# Patient Record
Sex: Male | Born: 1935
Health system: Southern US, Community
[De-identification: ages and names within clinical notes are randomized; demographics above are authoritative.]

## PROBLEM LIST (undated history)

## (undated) DIAGNOSIS — F329 Major depressive disorder, single episode, unspecified: Secondary | ICD-10-CM

## (undated) DIAGNOSIS — F32A Depression, unspecified: Secondary | ICD-10-CM

## (undated) HISTORY — DX: Major depressive disorder, single episode, unspecified: F32.9

## (undated) HISTORY — DX: Depression, unspecified: F32.A

---

## 2018-10-06 ENCOUNTER — Ambulatory Visit (HOSPITAL_BASED_OUTPATIENT_CLINIC_OR_DEPARTMENT_OTHER)
Admission: RE | Admit: 2018-10-06 | Discharge: 2018-10-06 | Disposition: A | Discharge status: Home / Self Care | Payer: Medicare HMO | Source: Ambulatory Visit | Attending: Family Medicine | Admitting: Family Medicine

## 2018-10-06 ENCOUNTER — Encounter: Payer: Self-pay | Admitting: Family Medicine

## 2018-10-06 ENCOUNTER — Ambulatory Visit (INDEPENDENT_AMBULATORY_CARE_PROVIDER_SITE_OTHER): Payer: Medicare HMO | Admitting: Family Medicine

## 2018-10-06 VITALS — BP 128/72 | HR 133 | Temp 98.0°F | Ht 67.5 in | Wt 254.2 lb

## 2018-10-06 DIAGNOSIS — M25561 Pain in right knee: Secondary | ICD-10-CM | POA: Insufficient documentation

## 2018-10-06 DIAGNOSIS — R1031 Right lower quadrant pain: Secondary | ICD-10-CM | POA: Insufficient documentation

## 2018-10-06 DIAGNOSIS — G8929 Other chronic pain: Secondary | ICD-10-CM | POA: Insufficient documentation

## 2018-10-06 DIAGNOSIS — F325 Major depressive disorder, single episode, in full remission: Secondary | ICD-10-CM | POA: Diagnosis not present

## 2018-10-06 DIAGNOSIS — K219 Gastro-esophageal reflux disease without esophagitis: Secondary | ICD-10-CM

## 2018-10-06 DIAGNOSIS — M7521 Bicipital tendinitis, right shoulder: Secondary | ICD-10-CM | POA: Insufficient documentation

## 2018-10-06 MED ORDER — OMEPRAZOLE 20 MG PO CPDR: 20.0000 mg | DELAYED_RELEASE_CAPSULE | Freq: Every day | ORAL | 2 refills | Status: DC

## 2018-10-06 NOTE — Progress Notes (Signed)
Chief Complaint  Patient presents with  . New Patient (Initial Visit)       New Patient Visit SUBJECTIVE: HPI: Mike Francis is an 85 y.o.male who is being seen for establishing care.  The patient was previously seen at an office in [place].  Hx of R sided pain that got worse recently. From neck down to knee. No inj or change in activity. No imaging or physical therapy done in the past.  This is gotten worse over the past 6 months.  Pain is on the right side of his neck radiating down the inside of his upper arm.  He also has low back pain rating into his glutes and groin.  He has knee pain as well.  He is worried he may have arthritis.  Patient has a history of reflux that is controlled with omeprazole.  He takes 1 tab per day.  He has a history of depression that he views is situational.  That situation is now resolved, where he does not have any family, and he is interested in coming off of the medication.  He is tolerating well.  No Known Allergies  Past Medical History:  Diagnosis Date  . Depression    History reviewed. No pertinent surgical history. Family History  Problem Relation Age of Onset  . Arthritis Mother   . Diabetes Mother    No Known Allergies  Current Outpatient Medications:  .  omeprazole (PRILOSEC) 20 MG capsule, Take 1 capsule (20 mg total) by mouth daily., Disp: 90 capsule, Rfl: 2 .  sertraline (ZOLOFT) 100 MG tablet, Take 0.5 tablets (50 mg total) by mouth daily., Disp: , Rfl:   ROS Psych: Denies depression  MSK: +knee pain   OBJECTIVE: BP 128/72 (BP Location: Left Arm, Patient Position: Sitting, Cuff Size: Normal)   Pulse (!) 133   Temp 98 F (36.7 C) (Oral)   Ht 5' 7.5" (1.715 m)   Wt 254 lb 4 oz (115.3 kg)   SpO2 97%   BMI 39.23 kg/m   Constitutional: -  VS reviewed -  Well developed, well nourished, appears stated age -  No apparent distress  Psychiatric: -  Oriented to person, place, and time -  Memory intact -  Affect and mood  normal -  Fluent conversation, good eye contact -  Judgment and insight age appropriate  Eye: -  Conjunctivae clear, no discharge -  Pupils symmetric, round, reactive to light  ENMT: -  MMM    Pharynx moist, no exudate, no erythema  Neck: -  No gross swelling, no palpable masses -  Thyroid midline, not enlarged, mobile, no palpable masses  Cardiovascular: -  RRR -  No LE edema  Respiratory: -  Normal respiratory effort, no accessory muscle use, no retraction -  Breath sounds equal, no wheezes, no ronchi, no crackles  Musculoskeletal: -  No clubbing, no cyanosis -  R shoulder: +TTP over coracoid, + speeds, empty can; unable to relax for Hawkins, negative Neer's and crossover -Poor range of motion of hamstrings bilaterally   R knee: No effusion, no joint line tenderness, patella is unremarkable, negative posterior/anterior drawer, varus/valgus stress, McMurray's -Right hip: +Stinchfield, neg log roll  Skin: -  No significant lesion on inspection -  Warm and dry to palpation   ASSESSMENT/PLAN: Depression, major, single episode, complete remission (HCC) - Plan: sertraline (ZOLOFT) 100 MG tablet  Biceps tendinitis of right upper extremity  Chronic pain of right knee - Plan: DG Knee Complete 4 Views  Right  Right groin pain - Plan: XR HIP UNILAT W OR W/O PELVIS 2-3 VIEWS RIGHT  Gastroesophageal reflux disease, esophagitis presence not specified - Plan: omeprazole (PRILOSEC) 20 MG capsule  Patient has records being sent here. We will decrease Zoloft from 100 mg daily to 50 mg daily. Stretches and exercises provided for biceps tendinitis. Check x-ray of knee and hip. Continue omeprazole. Patient should return 4 weeks to recheck extremities and see how he is doing on Zoloft.  If he is still doing well, we will take him off of it. The patient voiced understanding and agreement to the plan.   Jilda Roche [place], DO 10/06/18  12:15 PM

## 2018-10-06 NOTE — Patient Instructions (Signed)
Heat (pad or rice pillow in microwave) over affected area, 10-15 minutes twice daily.   OK to take Tylenol 1000 mg (2 extra strength tabs) or 975 mg (3 regular strength tabs) every 6 hours as needed.  We will be in contact regarding your X-rays.    Biceps Tendon Disruption (Proximal) Rehab Do exercises exactly as told by your health care provider and adjust them as directed. It is normal to feel mild stretching, pulling, tightness, or discomfort as you do these exercises, but you should stop right away if you feel sudden pain or your pain gets worse. Stretching and range of motion exercises These exercises warm up your muscles and joints and improve the movement and flexibility of your arm and shoulder. These exercises also help to relieve pain and stiffness. Exercise A: Shoulder flexion, standing   1. Stand facing a wall. Put your left / right hand on the wall. 2. Slide your left / right hand up the wall. Stop when you feel a stretch in your shoulder, or when you reach the angle recommended by your health care provider.  Use your other hand to help raise your arm, if needed.  As your hand gets higher, you may need to step closer to the wall.  Avoid shrugging your shoulder while you raise your arm. To do this, keep your shoulder blade tucked down toward your spine. 3. Hold for 30 seconds. 4. Slowly return to the starting position. Use your other arm to help, if needed. Repeat 2 times. Complete this exercise 3 times per week. Exercise B: Pendulum   1. Stand near a wall or a surface that you can hold onto for balance. 2. Bend at the waist and let your left / right arm hang straight down. Use your other arm to support you. 3. Relax your arm and shoulder muscles, and move your hips and your trunk so your left / right arm swings freely. Your arm should swing because of the motion of your body, not because you are using your arm or shoulder muscles. 4. Keep moving so your arm swings in the  following directions, as told by your health care provider:  Side to side.  Forward and backward.  In clockwise and counterclockwise circles. 5. Slowly return to the starting position. Repeat 2 times. Complete this exercise 3 times per week.  Strengthening exercises These exercises build strength and endurance in your arm and shoulder. Endurance is the ability to use your muscles for a long time, even after your muscles get tired. Exercise C: Elbow flexion, neutral  1. Sit on a stable chair without armrests, or stand. 2. Hold a 3-5 lb weight in your left / right hand, or hold an exercise band with both hands. Your palms should face each other at the starting position. 3. Bend your left / right elbow and move your hand up toward your shoulder.  Lead with your thumb, and keep your palm facing the same direction.  Keep your other arm straight down, in the starting position. 4. Slowly return to the starting position. Repeat 2-3 times. Complete this exercise 3 times per week. Exercise D: Forearm supination   1. Sit with your left / right forearm on a table. Your elbow should be below shoulder height. Rest your hand over the edge of the table so your palm faces down. 2. If directed, hold a hammer with your left / right hand. 3. Without moving your elbow, slowly rotate your hand so your palm faces up  toward the ceiling.  If you are holding a hammer, begin by holding the hammer near the head. When this exercise gets easier for you, hold the hammer farther down the handle. 4. Hold for 3 seconds. 5. Slowly return to the starting position. Repeat 2 times. Complete this exercise 3 times per week. Exercise E: Scapular retraction   1. Sit in a stable chair without armrests, or stand. 2. Secure an exercise band to a stable object in front of you so the band is at shoulder height. 3. Hold one end of the exercise band in each hand. 4. Squeeze your shoulder blades together and move your elbows  slightly behind you. Do not shrug your shoulders. 5. Hold for 3 seconds. 6. Slowly return to the starting position. Repeat 2 times. Complete this exercise 3 times per week. Exercise F: Scapular protraction, supine   1. Lie on your back on a firm surface. Hold a 3-5 lb weight in your left / right hand. 2. Raise your left / right arm straight into the air so your hand is directly above your shoulder joint. 3. Push the weight into the air so your shoulder lifts off of the surface that you are lying on. Do not move your head, neck, or back. 4. Hold for 3 seconds. 5. Slowly return to the starting position. Let your muscles relax completely before you repeat this exercise. Repeat 2 times. Complete this exercise 3 times per week. This information is not intended to replace advice given to you by your health care provider. Make sure you discuss any questions you have with your health care provider. Document Released: 08/20/2005 Document Revised: 04/26/2016 Document Reviewed: 07/29/2015 Elsevier Interactive Patient Education  2017 [employer].

## 2018-10-08 ENCOUNTER — Other Ambulatory Visit: Payer: Self-pay | Admitting: Family Medicine

## 2018-10-08 DIAGNOSIS — M25551 Pain in right hip: Secondary | ICD-10-CM

## 2018-10-08 DIAGNOSIS — M549 Dorsalgia, unspecified: Secondary | ICD-10-CM

## 2018-10-08 DIAGNOSIS — G8929 Other chronic pain: Secondary | ICD-10-CM

## 2018-10-15 ENCOUNTER — Telehealth: Payer: Self-pay | Admitting: *Deleted

## 2018-10-15 ENCOUNTER — Telehealth: Payer: Self-pay | Admitting: Family Medicine

## 2018-10-15 NOTE — Telephone Encounter (Signed)
Copied from CRM [id number]. Topic: Quick Communication - Home Health Verbal Orders >> Oct 14, 2018 12:51 PM Lynne Logan D wrote: Caller/Agency: [facility] Number: [phone] Requesting OT/PT/Skilled Nursing/Social Work: PT Frequency: 1 week 5   Called [facility] no answer//no voice mail to leave a message

## 2018-10-15 NOTE — Telephone Encounter (Signed)
Called no answer no voicemail

## 2018-10-15 NOTE — Telephone Encounter (Signed)
[facility] called back and gave verbal ok PER PCP for PT>

## 2018-10-15 NOTE — Telephone Encounter (Signed)
Received Physician Orders from [facility]; forwarded to provider/SLS 02/12  

## 2018-10-15 NOTE — Telephone Encounter (Signed)
Joe from [facility] is calling to state he is needing verbal orders to be completed to start PT  For the patient. He would like a call back at [phone]

## 2018-10-22 ENCOUNTER — Telehealth: Payer: Self-pay | Admitting: *Deleted

## 2018-10-22 NOTE — Telephone Encounter (Signed)
Received Home Health Certification and Plan of Care; forwarded to provider/SLS 02/19 

## 2018-11-07 ENCOUNTER — Telehealth: Payer: Self-pay | Admitting: *Deleted

## 2018-11-07 NOTE — Telephone Encounter (Signed)
Received Episode Detail Report from [facility]; forwarded to provider/SLS 03/06

## 2019-02-04 ENCOUNTER — Encounter: Payer: Self-pay | Admitting: Family Medicine

## 2019-02-04 ENCOUNTER — Other Ambulatory Visit: Payer: Self-pay

## 2019-02-04 ENCOUNTER — Ambulatory Visit (INDEPENDENT_AMBULATORY_CARE_PROVIDER_SITE_OTHER): Payer: Medicare HMO | Admitting: Family Medicine

## 2019-02-04 ENCOUNTER — Ambulatory Visit (HOSPITAL_BASED_OUTPATIENT_CLINIC_OR_DEPARTMENT_OTHER)
Admission: RE | Admit: 2019-02-04 | Discharge: 2019-02-04 | Disposition: A | Discharge status: Home / Self Care | Payer: Medicare HMO | Source: Ambulatory Visit | Attending: Family Medicine | Admitting: Family Medicine

## 2019-02-04 VITALS — BP 144/80 | HR 86 | Temp 99.0°F | Ht 67.0 in | Wt 262.2 lb

## 2019-02-04 DIAGNOSIS — F325 Major depressive disorder, single episode, in full remission: Secondary | ICD-10-CM | POA: Diagnosis not present

## 2019-02-04 DIAGNOSIS — M542 Cervicalgia: Secondary | ICD-10-CM

## 2019-02-04 DIAGNOSIS — G8929 Other chronic pain: Secondary | ICD-10-CM | POA: Insufficient documentation

## 2019-02-04 NOTE — Progress Notes (Signed)
Chief Complaint  Patient presents with  . Follow-up    Subjective: Patient is a 85 y.o. male here for f/u.  Pt had been decreased on Zoloft from 100 mg/d to 50 mg/d. Doing well. Situational issues have been eliminated. Not following with a counselor or psychologist. Interested in coming off altogether.  Continued having R sided neck pain. PT was not helpful. Worse in the AM. He is convinced that is it not muscular. Has never had imaging. No weakness in UE's. No recent worsening.  ROS: MSK: +Neck pain Psych: As noted in HPI  Past Medical History:  Diagnosis Date  . Depression     Objective: BP (!) 144/80 (BP Location: Left Arm, Patient Position: Sitting, Cuff Size: Normal)   Pulse 86   Temp 99 F (37.2 C) (Oral)   Ht 5\' 7"  (1.702 m)   Wt 262 lb 4 oz (119 kg)   SpO2 95%   BMI 41.07 kg/m  General: Awake, appears stated age Neuro: Neg Spurling's b/l, DTR's equal and symmetric in UE's b/l, no clonus; ambulates with aid of walker Heart: RRR, no bruits Lungs: CTAB, no rales, wheezes or rhonchi. No accessory muscle use Psych: Age appropriate judgment and insight, normal affect and mood  Assessment and Plan: Chronic neck pain - Plan: DG Cervical Spine Complete  Depression, major, single episode, complete remission (HCC)  1- Ck X-ray. Will refer to spine specialist if unremarkable. Cont Tylenol, heat, ice. 2- OK to come off of Zoloft. Sounds like it was quite situational.  Re-sign records release form.  F/u in 3 mo for CPE. The patient voiced understanding and agreement to the plan.  Jilda Roche [place], DO 02/04/19  3:24 PM

## 2019-02-04 NOTE — Patient Instructions (Addendum)
OK to stop the Zoloft.   We will be in contact regarding your X-ray. We may set you up with a spine specialist.   Heat (pad or rice pillow in microwave) over affected area, 10-15 minutes twice daily.   OK to take Tylenol 1000 mg (2 extra strength tabs) or 975 mg (3 regular strength tabs) every 6 hours as needed.  Let us know if you need anything.  EXERCISES RANGE OF MOTION (ROM) AND STRETCHING EXERCISES  These exercises may help you when beginning to rehabilitate your issue. In order to successfully resolve your symptoms, you must improve your posture. These exercises are designed to help reduce the forward-head and rounded-shoulder posture which contributes to this condition. Your symptoms may resolve with or without further involvement from your physician, physical therapist or athletic trainer. While completing these exercises, remember:   Restoring tissue flexibility helps normal motion to return to the joints. This allows healthier, less painful movement and activity.  An effective stretch should be held for at least 20 seconds, although you may need to begin with shorter hold times for comfort.  A stretch should never be painful. You should only feel a gentle lengthening or release in the stretched tissue.  Do not do any stretch or exercise that you cannot tolerate.  STRETCH- Axial Extensors  Lie on your back on the floor. You may bend your knees for comfort. Place a rolled-up hand towel or dish towel, about 2 inches in diameter, under the part of your head that makes contact with the floor.  Gently tuck your chin, as if trying to make a "double chin," until you feel a gentle stretch at the base of your head.  Hold 15-20 seconds. Repeat 2-3 times. Complete this exercise 1 time per day.   STRETCH - Axial Extension   Stand or sit on a firm surface. Assume a good posture: chest up, shoulders drawn back, abdominal muscles slightly tense, knees unlocked (if standing) and feet hip  width apart.  Slowly retract your chin so your head slides back and your chin slightly lowers. Continue to look straight ahead.  You should feel a gentle stretch in the back of your head. Be certain not to feel an aggressive stretch since this can cause headaches later.  Hold for 15-20 seconds. Repeat 2-3 times. Complete this exercise 1 time per day.  STRETCH - Cervical Side Bend   Stand or sit on a firm surface. Assume a good posture: chest up, shoulders drawn back, abdominal muscles slightly tense, knees unlocked (if standing) and feet hip width apart.  Without letting your nose or shoulders move, slowly tip your right / left ear to your shoulder until your feel a gentle stretch in the muscles on the opposite side of your neck.  Hold 15-20 seconds. Repeat 2-3 times. Complete this exercise 1-2 times per day.  STRETCH - Cervical Rotators   Stand or sit on a firm surface. Assume a good posture: chest up, shoulders drawn back, abdominal muscles slightly tense, knees unlocked (if standing) and feet hip width apart.  Keeping your eyes level with the ground, slowly turn your head until you feel a gentle stretch along the back and opposite side of your neck.  Hold 15-20 seconds. Repeat 2-3 times. Complete this exercise 1-2 times per day.  RANGE OF MOTION - Neck Circles   Stand or sit on a firm surface. Assume a good posture: chest up, shoulders drawn back, abdominal muscles slightly tense, knees unlocked (if standing) and  feet hip width apart.  Gently roll your head down and around from the back of one shoulder to the back of the other. The motion should never be forced or painful.  Repeat the motion 10-20 times, or until you feel the neck muscles relax and loosen. Repeat 2-3 times. Complete the exercise 1-2 times per day. STRENGTHENING EXERCISES - Cervical Strain and Sprain These exercises may help you when beginning to rehabilitate your injury. They may resolve your symptoms with or  without further involvement from your physician, physical therapist, or athletic trainer. While completing these exercises, remember:   Muscles can gain both the endurance and the strength needed for everyday activities through controlled exercises.  Complete these exercises as instructed by your physician, physical therapist, or athletic trainer. Progress the resistance and repetitions only as guided.  You may experience muscle soreness or fatigue, but the pain or discomfort you are trying to eliminate should never worsen during these exercises. If this pain does worsen, stop and make certain you are following the directions exactly. If the pain is still present after adjustments, discontinue the exercise until you can discuss the trouble with your clinician.  STRENGTH - Cervical Flexors, Isometric  Face a wall, standing about 6 inches away. Place a small pillow, a ball about 6-8 inches in diameter, or a folded towel between your forehead and the wall.  Slightly tuck your chin and gently push your forehead into the soft object. Push only with mild to moderate intensity, building up tension gradually. Keep your jaw and forehead relaxed.  Hold 10 to 20 seconds. Keep your breathing relaxed.  Release the tension slowly. Relax your neck muscles completely before you start the next repetition. Repeat 2-3 times. Complete this exercise 1 time per day.  STRENGTH- Cervical Lateral Flexors, Isometric   Stand about 6 inches away from a wall. Place a small pillow, a ball about 6-8 inches in diameter, or a folded towel between the side of your head and the wall.  Slightly tuck your chin and gently tilt your head into the soft object. Push only with mild to moderate intensity, building up tension gradually. Keep your jaw and forehead relaxed.  Hold 10 to 20 seconds. Keep your breathing relaxed.  Release the tension slowly. Relax your neck muscles completely before you start the next repetition. Repeat  2-3 times. Complete this exercise 1 time per day.  STRENGTH - Cervical Extensors, Isometric   Stand about 6 inches away from a wall. Place a small pillow, a ball about 6-8 inches in diameter, or a folded towel between the back of your head and the wall.  Slightly tuck your chin and gently tilt your head back into the soft object. Push only with mild to moderate intensity, building up tension gradually. Keep your jaw and forehead relaxed.  Hold 10 to 20 seconds. Keep your breathing relaxed.  Release the tension slowly. Relax your neck muscles completely before you start the next repetition. Repeat 2-3 times. Complete this exercise 1 time per day.  POSTURE AND BODY MECHANICS CONSIDERATIONS Keeping correct posture when sitting, standing or completing your activities will reduce the stress put on different body tissues, allowing injured tissues a chance to heal and limiting painful experiences. The following are general guidelines for improved posture. Your physician or physical therapist will provide you with any instructions specific to your needs. While reading these guidelines, remember:  The exercises prescribed by your provider will help you have the flexibility and strength to maintain correct  postures.  The correct posture provides the optimal environment for your joints to work. All of your joints have less wear and tear when properly supported by a spine with good posture. This means you will experience a healthier, less painful body.  Correct posture must be practiced with all of your activities, especially prolonged sitting and standing. Correct posture is as important when doing repetitive low-stress activities (typing) as it is when doing a single heavy-load activity (lifting).  PROLONGED STANDING WHILE SLIGHTLY LEANING FORWARD When completing a task that requires you to lean forward while standing in one place for a long time, place either foot up on a stationary 2- to 4-inch high  object to help maintain the best posture. When both feet are on the ground, the low back tends to lose its slight inward curve. If this curve flattens (or becomes too large), then the back and your other joints will experience too much stress, fatigue more quickly, and can cause pain.   RESTING POSITIONS Consider which positions are most painful for you when choosing a resting position. If you have pain with flexion-based activities (sitting, bending, stooping, squatting), choose a position that allows you to rest in a less flexed posture. You would want to avoid curling into a fetal position on your side. If your pain worsens with extension-based activities (prolonged standing, working overhead), avoid resting in an extended position such as sleeping on your stomach. Most people will find more comfort when they rest with their spine in a more neutral position, neither too rounded nor too arched. Lying on a non-sagging bed on your side with a pillow between your knees, or on your back with a pillow under your knees will often provide some relief. Keep in mind, being in any one position for a prolonged period of time, no matter how correct your posture, can still lead to stiffness.  WALKING Walk with an upright posture. Your ears, shoulders, and hips should all line up. OFFICE WORK When working at a desk, create an environment that supports good, upright posture. Without extra support, muscles fatigue and lead to excessive strain on joints and other tissues.  CHAIR:  A chair should be able to slide under your desk when your back makes contact with the back of the chair. This allows you to work closely.  The chair's height should allow your eyes to be level with the upper part of your monitor and your hands to be slightly lower than your elbows.  Body position: ? Your feet should make contact with the floor. If this is not possible, use a foot rest. ? Keep your ears over your shoulders. This will  reduce stress on your neck and low back.

## 2019-02-06 ENCOUNTER — Other Ambulatory Visit: Payer: Self-pay | Admitting: Family Medicine

## 2019-02-06 DIAGNOSIS — M5030 Other cervical disc degeneration, unspecified cervical region: Secondary | ICD-10-CM

## 2019-02-06 NOTE — Progress Notes (Signed)
Referral done

## 2019-03-10 ENCOUNTER — Encounter: Payer: Self-pay | Admitting: Physical Medicine & Rehabilitation

## 2019-03-16 ENCOUNTER — Telehealth: Payer: Self-pay | Admitting: *Deleted

## 2019-03-16 ENCOUNTER — Encounter: Payer: Self-pay | Admitting: Physical Medicine & Rehabilitation

## 2019-03-16 ENCOUNTER — Other Ambulatory Visit: Payer: Self-pay

## 2019-03-16 ENCOUNTER — Encounter: Payer: Medicare Other | Attending: Physical Medicine & Rehabilitation | Admitting: Physical Medicine & Rehabilitation

## 2019-03-16 VITALS — BP 112/72 | HR 83 | Temp 97.7°F | Ht 67.0 in | Wt 263.0 lb

## 2019-03-16 DIAGNOSIS — R269 Unspecified abnormalities of gait and mobility: Secondary | ICD-10-CM | POA: Diagnosis not present

## 2019-03-16 DIAGNOSIS — M542 Cervicalgia: Secondary | ICD-10-CM | POA: Diagnosis present

## 2019-03-16 DIAGNOSIS — M791 Myalgia, unspecified site: Secondary | ICD-10-CM | POA: Insufficient documentation

## 2019-03-16 DIAGNOSIS — G8929 Other chronic pain: Secondary | ICD-10-CM | POA: Insufficient documentation

## 2019-03-16 DIAGNOSIS — H9193 Unspecified hearing loss, bilateral: Secondary | ICD-10-CM | POA: Diagnosis present

## 2019-03-16 DIAGNOSIS — G479 Sleep disorder, unspecified: Secondary | ICD-10-CM | POA: Diagnosis not present

## 2019-03-16 MED ORDER — METHOCARBAMOL 500 MG PO TABS: 500.0000 mg | ORAL_TABLET | Freq: Two times a day (BID) | ORAL | 1 refills | Status: AC | PRN

## 2019-03-16 NOTE — Progress Notes (Addendum)
Subjective:    Patient ID: Mike Francis, male    DOB: 07/31/1931, 85 y.o.   MRN: [id number]  HPI  85 year old male with past medical history of depression, HOH, morbid obesity, CAD, Testosterone deficiency, polycythemia, OSA, osteopenia presents with right neck pain. History taken from chart and patient. Patient states he had imaging in [place] that showed ?radiculopathy. He notes neck pain since ~20 years.  Stable.  No alleviating factors, later states activity improves . Aleve helps. Mainly in the morning.  Can't describe.  States radiates from neck to foot on right side with facial symptoms at times.  Intermittent.  No benefit with PT. Denies associated numbness or weakness.  Falls bending over to pick something up, notes balance deficits. Pain does not limit anything.  Relatively poor historian.   Pain Inventory Average Pain 3 Pain Right Now 3 My pain is n/a  In the last 24 hours, has pain interfered with the following? General activity 0 Relation with others 0 Enjoyment of life 0 What TIME of day is your pain at its worst? morning,night Sleep (in general) Poor  Pain is worse with: walking Pain improves with: tylenol Relief from Meds: 0  Mobility use a walker do you drive?  yes  Function retired  Neuro/Psych weakness trouble walking depression  Prior Studies n/a  Physicians involved in your care n/a   Family History  Problem Relation Age of Onset  . Arthritis Mother   . Diabetes Mother    Social History   Socioeconomic History  . Marital status: Single    Spouse name: Not on file  . Number of children: Not on file  . Years of education: Not on file  . Highest education level: Not on file  Occupational History  . Not on file  Social Needs  . Financial resource strain: Not on file  . Food insecurity    Worry: Not on file    Inability: Not on file  . Transportation needs    Medical: Not on file    Non-medical: Not on file  Tobacco Use  .  Smoking status: Never Smoker  . Smokeless tobacco: Never Used  Substance and Sexual Activity  . Alcohol use: Never    Frequency: Never  . Drug use: Never  . Sexual activity: Not on file  Lifestyle  . Physical activity    Days per week: Not on file    Minutes per session: Not on file  . Stress: Not on file  Relationships  . Social Musicianconnections    Talks on phone: Not on file    Gets together: Not on file    Attends religious service: Not on file    Active member of club or organization: Not on file    Attends meetings of clubs or organizations: Not on file    Relationship status: Not on file  Other Topics Concern  . Not on file  Social History Narrative  . Not on file   No past surgical history on file. Past Medical History:  Diagnosis Date  . Depression    BP 112/72   Pulse 83   Temp 97.7 F (36.5 C)   Ht 5\' 7"  (1.702 m)   Wt 263 lb (119.3 kg)   SpO2 92%   BMI 41.19 kg/m   Opioid Risk Score:   Fall Risk Score:  `1  Depression screen PHQ 2/9  Depression screen PHQ 2/9 03/16/2019  Decreased Interest 0  Down, Depressed, Hopeless 1  PHQ -  2 Score 1  Altered sleeping 3  Tired, decreased energy 0  Change in appetite 0  Feeling bad or failure about yourself  0  Trouble concentrating 0  Moving slowly or fidgety/restless 0  Suicidal thoughts 0  PHQ-9 Score 4        Review of Systems  Constitutional: Negative for appetite change.  HENT: Positive for hearing loss.   Respiratory: Negative.   Musculoskeletal: Positive for arthralgias, gait problem, myalgias, neck pain and neck stiffness.  Neurological: Negative for seizures, syncope, speech difficulty, weakness and numbness.  All other systems reviewed and are negative.      Objective:   Physical Exam Gen: NAD. Vital signs reviewed HENT: Normocephalic, Atraumatic Eyes: EOMI. No discharge.  Cardio: No JVD. Pulm: Effort normal Abd: Nondistended MSK:  Gait antalgic and kyphotic and wide based.   TTP  diffusely.    Mild edema  Neuro: CN II-XII grossly intact.    Very HOH  Sensation intact to light touch in all UE dermatomes  Strength  5/5 in all UE myotomes  Neg Spurling's b/l UE  Neg decompression test  Mild tremor RUE Skin: scattered rash     Assessment & Plan:  85 year old male with past medical history of depression, HOH, morbid obesity, CAD, Testosterone deficiency, polycythemia, OSA, osteopenia presents with right neck pain.   1. Chronic right neck pain  Given history and symptoms, patient may more centralized process - will consider brain MRI  Xray reviewed, showing DJD  Per pt, MRI showing radic, will request films  Patient states labs to be drawn soon, recommended bring in for eval  Referral information reviewed  No benefit with heat/cold, PT  Will order Robaxin 500 BID PRN  Will consider trial TENS  Will consider bracing  Will consider Voltaren gel/Lidoderm  Will consider Gabapentin  Will consider Cymbalta  Will consider Mobic   2. Gait abnormality  Cont walker for safety   3. Sleep disturbance  Will consider medicaitons  4. Morbid Obesity  Will consider referral to dietitian  5. Myalgia   Will consider Trigger point injections  6. HOH  Has hearing aids, encouraged use

## 2019-03-16 NOTE — Telephone Encounter (Signed)
Notified pt ---requested the record to [facility]) for x-ray, MRI, and recent labs.

## 2019-03-23 ENCOUNTER — Telehealth: Payer: Self-pay | Admitting: Family Medicine

## 2019-03-23 NOTE — Telephone Encounter (Signed)
Home Health Verbal Orders - Caller/Agency: Scott with [facility] Number: [phone], OK to leave a message Requesting OT/PT/Skilled Nursing/Social Work/Speech Therapy: skilled nursing Frequency: evaluation only due to medication management - concerns about pt's ability to self manage medication (concern shared by niece, Brock Larmon)

## 2019-03-23 NOTE — Telephone Encounter (Signed)
OK 

## 2019-03-23 NOTE — Telephone Encounter (Signed)
Called [facility] gave PCP verbal ok. [facility]N did request to fax to their office most recent OV notes//medication list//demographics to [phone].

## 2019-04-02 ENCOUNTER — Telehealth: Payer: Self-pay | Admitting: Family Medicine

## 2019-04-02 NOTE — Telephone Encounter (Signed)
Spoke w/ [place], verbal orders given.

## 2019-04-02 NOTE — Telephone Encounter (Signed)
Caller/Agency: Lelon Frohlich with [facility]  Requesting: Nursing for medication management  Frequency: 5 visits Call back # [phone]

## 2019-04-08 ENCOUNTER — Telehealth: Payer: Self-pay | Admitting: Family Medicine

## 2019-04-08 NOTE — Telephone Encounter (Signed)
Called and they will refax this order over as have not seen it. Once we receive it PCP will sign//will then fax

## 2019-04-08 NOTE — Telephone Encounter (Signed)
Mike Francis, from [facility], called stating a fax had been sent over home health care referral order form. She states they faxed this over on 03/23/2019. Becky stating she needs PCP signature. Please advise.   [phone] Fax # [phone]

## 2019-04-13 ENCOUNTER — Other Ambulatory Visit: Payer: Self-pay

## 2019-04-13 ENCOUNTER — Encounter: Payer: Medicare Other | Attending: Physical Medicine & Rehabilitation | Admitting: Physical Medicine & Rehabilitation

## 2019-04-13 ENCOUNTER — Encounter: Payer: Self-pay | Admitting: Physical Medicine & Rehabilitation

## 2019-04-13 VITALS — BP 155/83 | HR 86 | Temp 99.2°F | Ht 67.0 in | Wt 253.3 lb

## 2019-04-13 DIAGNOSIS — M79604 Pain in right leg: Secondary | ICD-10-CM

## 2019-04-13 DIAGNOSIS — R269 Unspecified abnormalities of gait and mobility: Secondary | ICD-10-CM | POA: Insufficient documentation

## 2019-04-13 DIAGNOSIS — G479 Sleep disorder, unspecified: Secondary | ICD-10-CM | POA: Insufficient documentation

## 2019-04-13 DIAGNOSIS — H9193 Unspecified hearing loss, bilateral: Secondary | ICD-10-CM | POA: Diagnosis present

## 2019-04-13 DIAGNOSIS — G8929 Other chronic pain: Secondary | ICD-10-CM | POA: Diagnosis present

## 2019-04-13 DIAGNOSIS — M791 Myalgia, unspecified site: Secondary | ICD-10-CM | POA: Insufficient documentation

## 2019-04-13 DIAGNOSIS — M542 Cervicalgia: Secondary | ICD-10-CM | POA: Diagnosis not present

## 2019-04-13 DIAGNOSIS — M25511 Pain in right shoulder: Secondary | ICD-10-CM

## 2019-04-13 MED ORDER — DICLOFENAC SODIUM 1 % TD GEL: 2.0000 g | Freq: Four times a day (QID) | TRANSDERMAL | 1 refills | Status: DC

## 2019-04-13 NOTE — Progress Notes (Signed)
Subjective:    Patient ID: Mike Francis, male    DOB: May 17, 1931, 85 y.o.   MRN: [id number]  HPI  Male with past medical history of depression, HOH, morbid obesity, CAD, Testosterone deficiency, polycythemia, OSA, osteopenia presents with right neck pain.  History taken from chart and patient. Patient states he had imaging in [place] that showed ?radiculopathy. He notes neck pain since ~20 years.  Stable.  No alleviating factors, later states activity improves . Aleve helps. Mainly in the morning.  Can't describe.  States radiates from neck to foot on right side with facial symptoms at times.  Intermittent.  No benefit with PT. Denies associated numbness or weakness.  Falls bending over to pick something up, notes balance deficits. Pain does not limit anything.  Relatively poor historian.   Last clinic visit 03/16/2019.  Since that time, pt states his PCP has his previous imaging and lab work. Notes improvement with Robaxin in neck and shoulder. Forgot walker today.  States he fell when his leg "buckled".  He fell on his left hip, "slow fall". He states he was told no fractures. Today his main compliant is right leg pain.   Pain Inventory Average Pain 2 Pain Right Now 0 My pain is sharp, stabbing and cramp  In the last 24 hours, has pain interfered with the following? General activity 1 Relation with others 0 Enjoyment of life 0 What TIME of day is your pain at its worst? morning,night Sleep (in general) Poor  Pain is worse with: walking Pain improves with: tylenol Relief from Meds: 0  Mobility use a walker do you drive?  yes  Function retired  Neuro/Psych weakness trouble walking depression  Prior Studies n/a  Physicians involved in your care n/a   Family History  Problem Relation Age of Onset  . Arthritis Mother   . Diabetes Mother    Social History   Socioeconomic History  . Marital status: Single    Spouse name: Not on file  . Number of children: Not  on file  . Years of education: Not on file  . Highest education level: Not on file  Occupational History  . Not on file  Social Needs  . Financial resource strain: Not on file  . Food insecurity    Worry: Not on file    Inability: Not on file  . Transportation needs    Medical: Not on file    Non-medical: Not on file  Tobacco Use  . Smoking status: Never Smoker  . Smokeless tobacco: Never Used  Substance and Sexual Activity  . Alcohol use: Never    Frequency: Never  . Drug use: Never  . Sexual activity: Not on file  Lifestyle  . Physical activity    Days per week: Not on file    Minutes per session: Not on file  . Stress: Not on file  Relationships  . Social Herbalist on phone: Not on file    Gets together: Not on file    Attends religious service: Not on file    Active member of club or organization: Not on file    Attends meetings of clubs or organizations: Not on file    Relationship status: Not on file  Other Topics Concern  . Not on file  Social History Narrative  . Not on file   No past surgical history on file. Past Medical History:  Diagnosis Date  . Depression    BP (!) 155/83  Pulse 86   Temp 99.2 F (37.3 C) (Oral) Comment: 97.5 temporal  Ht 5\' 7"  (1.702 m)   Wt 253 lb 4.8 oz (114.9 kg)   SpO2 93%   BMI 39.67 kg/m   Opioid Risk Score:   Fall Risk Score:  `1  Depression screen PHQ 2/9  Depression screen PHQ 2/9 03/16/2019  Decreased Interest 0  Down, Depressed, Hopeless 1  PHQ - 2 Score 1  Altered sleeping 3  Tired, decreased energy 0  Change in appetite 0  Feeling bad or failure about yourself  0  Trouble concentrating 0  Moving slowly or fidgety/restless 0  Suicidal thoughts 0  PHQ-9 Score 4        Review of Systems  Constitutional: Negative for appetite change.  HENT: Positive for hearing loss.   Respiratory: Negative.   Musculoskeletal: Positive for arthralgias, gait problem, myalgias, neck pain and neck  stiffness.  Neurological: Negative for seizures, syncope, speech difficulty, weakness and numbness.  All other systems reviewed and are negative.      Objective:   Physical Exam Gen: NAD. Vital signs reviewed HENT: Normocephalic. Atraumatic. Eyes: EOMI. No discharge.  Cardio: No JVD. Pulm: Effort normal. Abd: Nondistended MSK:  Gait antalgic and kyphotic and wide based.  Limitation with ROM in right shoulder   No TTP  Neuro:  Very HOH  Strength  5/5 in all UE myotomes  Neg Spurling's b/l UE  Neg SLR Skin: scattered rash    Assessment & Plan:  85 year old male with past medical history of depression, HOH, morbid obesity, CAD, Testosterone deficiency, polycythemia, OSA, osteopenia presents with right neck pain.   1. Chronic right neck pain  Given history and symptoms, patient may more centralized process - will consider brain MRI  Xray reviewed, showing DJD  Per pt, MRI showing radic, will request films again  Patient states labs to be drawn soon, will follow up with PCP again  No benefit with heat/cold  Cont Robaxin 500 BID PRN  Will consider trial TENS  Will consider bracing  Will consider Lidoderm  Will consider Gabapentin  Will consider Cymbalta  Will consider Mobic  Will refer to PT   2. Gait abnormality  Cont walker for safety, encouraged compliance all the time  See #1   3. Sleep disturbance  Will consider medicaitons  4. Morbid Obesity  Will consider referral to dietitian  States meals are fixed at SNF and hard to get good meals  5. Myalgia   Will consider Trigger point injections  6. HOH  Has hearing aids, encouraged use  7. Right shoulder pain  Will order Voltaren gel  Will consider xray  8. Right leg pain  Does not want medication at present

## 2019-04-14 DIAGNOSIS — Z9181 History of falling: Secondary | ICD-10-CM

## 2019-04-14 DIAGNOSIS — G8929 Other chronic pain: Secondary | ICD-10-CM

## 2019-04-14 DIAGNOSIS — T43225D Adverse effect of selective serotonin reuptake inhibitors, subsequent encounter: Secondary | ICD-10-CM

## 2019-04-14 DIAGNOSIS — F325 Major depressive disorder, single episode, in full remission: Secondary | ICD-10-CM | POA: Diagnosis not present

## 2019-04-14 DIAGNOSIS — R4182 Altered mental status, unspecified: Secondary | ICD-10-CM | POA: Diagnosis not present

## 2019-04-14 DIAGNOSIS — M542 Cervicalgia: Secondary | ICD-10-CM

## 2019-05-08 ENCOUNTER — Encounter: Payer: Self-pay | Admitting: Family Medicine

## 2019-05-08 ENCOUNTER — Ambulatory Visit (INDEPENDENT_AMBULATORY_CARE_PROVIDER_SITE_OTHER): Payer: Medicare Other | Admitting: Family Medicine

## 2019-05-08 ENCOUNTER — Other Ambulatory Visit: Payer: Self-pay

## 2019-05-08 VITALS — BP 122/80 | HR 95 | Temp 95.1°F | Ht 67.0 in | Wt 249.5 lb

## 2019-05-08 DIAGNOSIS — E291 Testicular hypofunction: Secondary | ICD-10-CM | POA: Diagnosis not present

## 2019-05-08 DIAGNOSIS — Z0000 Encounter for general adult medical examination without abnormal findings: Secondary | ICD-10-CM | POA: Diagnosis not present

## 2019-05-08 DIAGNOSIS — G4733 Obstructive sleep apnea (adult) (pediatric): Secondary | ICD-10-CM | POA: Diagnosis not present

## 2019-05-08 DIAGNOSIS — Z9989 Dependence on other enabling machines and devices: Secondary | ICD-10-CM

## 2019-05-08 DIAGNOSIS — D751 Secondary polycythemia: Secondary | ICD-10-CM | POA: Insufficient documentation

## 2019-05-08 DIAGNOSIS — Z23 Encounter for immunization: Secondary | ICD-10-CM

## 2019-05-08 NOTE — Patient Instructions (Addendum)
I recommend getting the flu shot in mid October. This suggestion would change if the CDC comes out with a different recommendation.   The new Shingrix vaccine (for shingles) is a 2 shot series. It can make people feel low energy, achy and almost like they have the flu for 48 hours after injection. Please plan accordingly when deciding on when to get this shot. Call your pharmacy for an appointment to get this. The second shot of the series is less severe regarding the side effects, but it still lasts 48 hours.   Ask about getting the tetanus at your pharmacy.   Keep the diet clean and stay active.  Give [place] 2-3 business days to get the results of your labs back.   Take 1000 units of Vit D daily along with 1200 mg of Calcium.  Let us know if you need anything.

## 2019-05-08 NOTE — Progress Notes (Signed)
Subjective:   Mike Francis is a 85 y.o. male who presents for Medicare Annual/Subsequent preventive examination.  Review of Systems:  +neck and shoulder pain 10 pt ros neg otherwise       Objective:    Vitals: BP 122/80 (BP Location: Left Arm, Patient Position: Sitting, Cuff Size: Large)   Pulse 95   Temp (!) 95.1 F (35.1 C) (Temporal)   Ht 5\' 7"  (1.702 m)   Wt 249 lb 8 oz (113.2 kg)   SpO2 96%   BMI 39.08 kg/m   Body mass index is 39.08 kg/m.  Has lost weight intentionally  Tobacco Social History   Tobacco Use  Smoking Status Never Smoker  Smokeless Tobacco Never Used     Counseling given: N/A   Past Medical History:  Diagnosis Date  . Depression    Family History  Problem Relation Age of Onset  . Arthritis Mother   . Diabetes Mother     Outpatient Encounter Medications as of 05/08/2019  Medication Sig  . acetaminophen (TYLENOL) 650 MG CR tablet Take 650 mg by mouth every 8 (eight) hours as needed for pain.  Marland Kitchen. diclofenac sodium (VOLTAREN) 1 % GEL Apply 2 g topically 4 (four) times daily.  . methocarbamol (ROBAXIN) 500 MG tablet Take 1 tablet (500 mg total) by mouth 2 (two) times daily as needed for muscle spasms.  Marland Kitchen. omeprazole (PRILOSEC) 20 MG capsule Take 1 capsule (20 mg total) by mouth daily.   Activities of Daily Living Lives in assisted living facility.  Patient Care Team: Sharlene DoryWendling,  Paul, DO as PCP - General (Family Medicine)   Assessment:   This is a routine wellness examination for Mike CooterHenry.  Exercise Activities and Dietary recommendations    Goals   None     Fall Risk Fall Risk  04/13/2019  Falls in the past year? 1  Comment last fall March 30 2019  Number falls in past yr: 1   Is the patient's home free of loose throw rugs in walkways, pet beds, electrical cords, etc?   yes      Grab bars in the bathroom? yes      Handrails on the stairs?   yes      Adequate lighting?   yes   Depression Screen PHQ-2: 0  Cognitive  Function A&Ox4  Qualifies for Shingles Vaccine? Yes  Screening Tests Health Maintenance  Topic Date Due  . TETANUS/TDAP  11/09/1949  . INFLUENZA VACCINE  04/04/2019  . PNA vac Low Risk Adult  Completed   Cancer Screenings: Lung: Low Dose CT Chest recommended if Age 24-80 years, 30 pack-year currently smoking OR have quit w/in 15years. Patient does not qualify. Colorectal: No longer qualifies     Plan:  Medicare annual wellness visit, subsequent  OSA on CPAP - Plan: For home use only DME continuous positive airway pressure (CPAP)  Morbid obesity (HCC) - Plan: Comprehensive metabolic panel, Lipid panel, CANCELED: Lipid panel, CANCELED: Comprehensive metabolic panel  Hypogonadism in male - Plan: Testosterone, CANCELED: Testosterone  Polycythemia - Plan: CBC, CANCELED: CBC  Need for vaccination against Streptococcus pneumoniae - Plan: Pneumococcal polysaccharide vaccine 23-valent greater than or equal to 2yo subcutaneous/IM   I have personally reviewed and noted the following in the patient's chart:   . Medical and social history . Use of alcohol, tobacco or illicit drugs  . Current medications and supplements . Functional ability and status . Nutritional status . Physical activity . Advanced directives . List of other  physicians . Hospitalizations, surgeries, and ER visits in previous 12 months . Vitals . Screenings to include cognitive, depression, and falls . Referrals and appointments- rec'd he f/u with PM&R  In addition, I have reviewed and discussed with patient certain preventive protocols, quality metrics, and best practice recommendations. A written personalized care plan for preventive services as well as general preventive health recommendations were provided to patient.     [place], DO  05/09/2019

## 2019-05-09 LAB — COMPREHENSIVE METABOLIC PANEL
AG Ratio: 2.2 (calc) (ref 1.0–2.5)
ALT: 16 U/L (ref 9–46)
AST: 22 U/L (ref 10–35)
Albumin: 4.3 g/dL (ref 3.6–5.1)
Alkaline phosphatase (APISO): 71 U/L (ref 35–144)
BUN: 18 mg/dL (ref 7–25)
CO2: 24 mmol/L (ref 20–32)
Calcium: 9.9 mg/dL (ref 8.6–10.3)
Chloride: 107 mmol/L (ref 98–110)
Creat: 1.08 mg/dL (ref 0.70–1.11)
Globulin: 2 g/dL (calc) (ref 1.9–3.7)
Glucose, Bld: 96 mg/dL (ref 65–99)
Potassium: 4.7 mmol/L (ref 3.5–5.3)
Sodium: 143 mmol/L (ref 135–146)
Total Bilirubin: 0.5 mg/dL (ref 0.2–1.2)
Total Protein: 6.3 g/dL (ref 6.1–8.1)

## 2019-05-09 LAB — CBC
HCT: 46.9 % (ref 38.5–50.0)
Hemoglobin: 15.7 g/dL (ref 13.2–17.1)
MCH: 30.1 pg (ref 27.0–33.0)
MCHC: 33.5 g/dL (ref 32.0–36.0)
MCV: 89.8 fL (ref 80.0–100.0)
MPV: 11.1 fL (ref 7.5–12.5)
Platelets: 229 10*3/uL (ref 140–400)
RBC: 5.22 10*6/uL (ref 4.20–5.80)
RDW: 12.9 % (ref 11.0–15.0)
WBC: 7.3 10*3/uL (ref 3.8–10.8)

## 2019-05-09 LAB — TESTOSTERONE: Testosterone: 22 ng/dL — ABNORMAL LOW (ref 250–827)

## 2019-05-09 LAB — LIPID PANEL
Cholesterol: 205 mg/dL — ABNORMAL HIGH (ref <200)
HDL: 44 mg/dL (ref >=40)
LDL Cholesterol (Calc): 127 mg/dL (calc) — ABNORMAL HIGH
Non-HDL Cholesterol (Calc): 161 mg/dL (calc) — ABNORMAL HIGH (ref <130)
Total CHOL/HDL Ratio: 4.7 (calc) (ref <5.0)
Triglycerides: 195 mg/dL — ABNORMAL HIGH (ref <150)

## 2019-05-12 ENCOUNTER — Ambulatory Visit: Payer: Medicare Other | Admitting: Physical Medicine & Rehabilitation

## 2019-05-19 ENCOUNTER — Telehealth: Payer: Self-pay | Admitting: Family Medicine

## 2019-05-19 DIAGNOSIS — E291 Testicular hypofunction: Secondary | ICD-10-CM

## 2019-05-19 NOTE — Telephone Encounter (Signed)
Copied from [place] [id number]. Topic: General - Other >> May 19, 2019  4:06 PM Celene Kras A wrote: Reason for CRM: Pt called and is requesting to have his lab results. Please advise.

## 2019-05-20 NOTE — Telephone Encounter (Signed)
Sorry for delay. Testosterone is low. Cholesterol is elevated. Would repeat free testosterone early next week and if he is interested in treatment, we can get him in with a specialist. Order is placed. Ty.

## 2019-05-20 NOTE — Telephone Encounter (Signed)
Patient notified.  appt scheduled

## 2019-05-25 ENCOUNTER — Other Ambulatory Visit: Payer: Self-pay

## 2019-05-25 ENCOUNTER — Other Ambulatory Visit (INDEPENDENT_AMBULATORY_CARE_PROVIDER_SITE_OTHER): Payer: Medicare Other

## 2019-05-25 ENCOUNTER — Telehealth: Payer: Self-pay | Admitting: Family Medicine

## 2019-05-25 DIAGNOSIS — K219 Gastro-esophageal reflux disease without esophagitis: Secondary | ICD-10-CM

## 2019-05-25 DIAGNOSIS — E291 Testicular hypofunction: Secondary | ICD-10-CM

## 2019-05-25 MED ORDER — SERTRALINE HCL 100 MG PO TABS: 100.0000 mg | ORAL_TABLET | Freq: Every day | ORAL | 2 refills | Status: AC

## 2019-05-25 MED ORDER — OMEPRAZOLE 20 MG PO CPDR: 20.0000 mg | DELAYED_RELEASE_CAPSULE | Freq: Every day | ORAL | 2 refills | Status: AC

## 2019-05-25 NOTE — Telephone Encounter (Signed)
Pt came in office stating would like to change his pharmacy on chart, pt would like the rest of meds for now on sent to [facility]. Pt stated is needing refill on omeprazole (Prilosec) sent to pharmacy and to please cancel the order for rx Sertraline (Zoloft) from [facility] and to have it sent to pharmacy [facility], (pt stated has not pick up this rx and needing it to be sent to [facility])

## 2019-05-25 NOTE — Addendum Note (Signed)
Addended by: Kelle Darting A on: 05/25/2019 01:30 PM   Modules accepted: Orders

## 2019-05-25 NOTE — Telephone Encounter (Signed)
Updated pharmacy list/name in chart. Refilled both medications to [facility]. Called [facility] and did cancel any previous prescriptions sent in to them Called the patient informed message/request received and all taken care of.

## 2019-05-26 ENCOUNTER — Other Ambulatory Visit: Payer: Self-pay | Admitting: Family Medicine

## 2019-05-26 DIAGNOSIS — E291 Testicular hypofunction: Secondary | ICD-10-CM

## 2019-05-26 LAB — TESTOSTERONE TOTAL,FREE,BIO, MALES
Albumin: 4.1 g/dL (ref 3.6–5.1)
Sex Hormone Binding: 124 nmol/L — ABNORMAL HIGH (ref 22–77)
Testosterone: 16 ng/dL — ABNORMAL LOW (ref 250–827)

## 2019-06-10 ENCOUNTER — Other Ambulatory Visit: Payer: Self-pay

## 2019-06-10 ENCOUNTER — Other Ambulatory Visit (INDEPENDENT_AMBULATORY_CARE_PROVIDER_SITE_OTHER): Payer: Medicare Other

## 2019-06-10 DIAGNOSIS — E291 Testicular hypofunction: Secondary | ICD-10-CM

## 2019-06-10 MED FILL — SERTRALINE HCL 100 MG TABS: 100 | 90 days supply | Qty: 90 | Fill #0

## 2019-06-10 MED FILL — OMEPRAZOLE 20 MG CAP: 20 | 90 days supply | Qty: 90 | Fill #0

## 2019-06-18 ENCOUNTER — Telehealth: Payer: Self-pay | Admitting: *Deleted

## 2019-06-18 NOTE — Telephone Encounter (Signed)
Patient stated that he would like a 90 day supply of muscle relaxer sent to [facility].  It was last sent by another provider.  Will you refill?

## 2019-06-18 NOTE — Telephone Encounter (Signed)
Patient updated on status of blood that he had given last week.  He seems pretty anxious to know what is going on with him and would like diagnosis explained to him in detail once results come in.

## 2019-06-18 NOTE — Telephone Encounter (Signed)
Spoke with Mike Francis at [facility] regarding free testosterone from 06/10/19.  She states that she can see the specimen was shipped but is not showing that it has been received yet. She is waiting on a return call from the facility that runs the test to determine the outcome. She will call me back. If we do not hear back from her by Friday, we should call and ask to speak with her. She works at [employer] location.

## 2019-06-18 NOTE — Telephone Encounter (Signed)
[name] can you just keep an eye on this.  Hopefully the lab will get back with Mike Francis tomorrow about this.  He had called about his labwork from last week and I noticed that it had not been completed yet.

## 2019-06-19 LAB — TESTOSTERONE, FREE: TESTOSTERONE FREE: 0.4 pg/mL — ABNORMAL LOW (ref 6.0–73.0)

## 2019-06-19 NOTE — Telephone Encounter (Signed)
He should contact Dr. Posey Pronto, the original prescriber. Ty.

## 2019-06-19 NOTE — Telephone Encounter (Signed)
Spoke with Anglea. She has no answer for why result is still pending. She is doing some additional checking and will get back with me Monday.

## 2019-06-19 NOTE — Telephone Encounter (Signed)
Patient informed and verbalized understanding

## 2019-06-22 ENCOUNTER — Telehealth: Payer: Self-pay | Admitting: Family Medicine

## 2019-06-22 ENCOUNTER — Other Ambulatory Visit: Payer: Self-pay | Admitting: Family Medicine

## 2019-06-22 DIAGNOSIS — R7989 Other specified abnormal findings of blood chemistry: Secondary | ICD-10-CM

## 2019-06-22 NOTE — Telephone Encounter (Signed)
I was not able to speak with Mike Francis today regarding reason for delayed result but Testosterone level is not complete in Epic.

## 2019-06-22 NOTE — Telephone Encounter (Signed)
Result is back.

## 2019-06-22 NOTE — Telephone Encounter (Signed)
Copied from [place] [id number]. Topic: Quick Communication - See Telephone Encounter >> Jun 22, 2019  9:01 AM Loma [place] wrote: CRM for notification. See Telephone encounter for: 06/22/19. Pt has lab results and nurse wanted a call back, lines busy pls give cb to [phone] See results notes

## 2019-06-30 ENCOUNTER — Other Ambulatory Visit: Payer: Self-pay

## 2019-07-01 ENCOUNTER — Ambulatory Visit (INDEPENDENT_AMBULATORY_CARE_PROVIDER_SITE_OTHER): Payer: Medicare Other | Admitting: Family Medicine

## 2019-07-01 ENCOUNTER — Other Ambulatory Visit: Payer: Self-pay

## 2019-07-01 ENCOUNTER — Encounter: Payer: Self-pay | Admitting: Family Medicine

## 2019-07-01 VITALS — BP 130/82 | HR 88 | Temp 97.1°F | Ht 67.0 in | Wt 246.0 lb

## 2019-07-01 DIAGNOSIS — H6123 Impacted cerumen, bilateral: Secondary | ICD-10-CM | POA: Diagnosis not present

## 2019-07-01 NOTE — Patient Instructions (Addendum)
OK to use Debrox (peroxide) in the ear to loosen up wax. Also recommend using a bulb syringe (for removing boogers from baby's noses) to flush through warm water. Do not use Q-tips as this can impact wax further.   Earwax Buildup, Adult The ears produce a substance called earwax that helps keep bacteria out of the ear and protects the skin in the ear canal. Occasionally, earwax can build up in the ear and cause discomfort or hearing loss. What increases the risk? This condition is more likely to develop in people who:  Are male.  Are elderly.  Naturally produce more earwax.  Clean their ears often with cotton swabs.  Use earplugs often.  Use in-ear headphones often.  Wear hearing aids.  Have narrow ear canals.  Have earwax that is overly thick or sticky.  Have eczema.  Are dehydrated.  Have excess hair in the ear canal. What are the signs or symptoms? Symptoms of this condition include:  Reduced or muffled hearing.  A feeling of fullness in the ear or feeling that the ear is plugged.  Fluid coming from the ear.  Ear pain.  Ear itch.  Ringing in the ear.  Coughing.  An obvious piece of earwax that can be seen inside the ear canal. How is this diagnosed? This condition may be diagnosed based on:  Your symptoms.  Your medical history.  An ear exam. During the exam, your health care provider will look into your ear with an instrument called an otoscope. You may have tests, including a hearing test. How is this treated? This condition may be treated by:  Using ear drops to soften the earwax.  Having the earwax removed by a health care provider. The health care provider may: ? Flush the ear with water. ? Use an instrument that has a loop on the end (curette). ? Use a suction device.  Surgery to remove the wax buildup. This may be done in severe cases. Follow these instructions at home:   Take over-the-counter and prescription medicines only as told by  your health care provider.  Do not put any objects, including cotton swabs, into your ear. You can clean the opening of your ear canal with a washcloth or facial tissue.  Follow instructions from your health care provider about cleaning your ears. Do not over-clean your ears.  Drink enough fluid to keep your urine clear or pale yellow. This will help to thin the earwax.  Keep all follow-up visits as told by your health care provider. If earwax builds up in your ears often or if you use hearing aids, consider seeing your health care provider for routine, preventive ear cleanings. Ask your health care provider how often you should schedule your cleanings.  If you have hearing aids, clean them according to instructions from the manufacturer and your health care provider. Contact a health care provider if:  You have ear pain.  You develop a fever.  You have blood, pus, or other fluid coming from your ear.  You have hearing loss.  You have ringing in your ears that does not go away.  Your symptoms do not improve with treatment.  You feel like the room is spinning (vertigo). Summary  Earwax can build up in the ear and cause discomfort or hearing loss.  The most common symptoms of this condition include reduced or muffled hearing and a feeling of fullness in the ear or feeling that the ear is plugged.  This condition may be diagnosed   based on your symptoms, your medical history, and an ear exam.  This condition may be treated by using ear drops to soften the earwax or by having the earwax removed by a health care provider.  Do not put any objects, including cotton swabs, into your ear. You can clean the opening of your ear canal with a washcloth or facial tissue. This information is not intended to replace advice given to you by your health care provider. Make sure you discuss any questions you have with your health care provider. Document Released: 09/27/2004 Document Revised:  08/02/2017 Document Reviewed: 10/31/2016 Elsevier Patient Education  2020 Elsevier Inc.  

## 2019-07-01 NOTE — Progress Notes (Signed)
Chief Complaint  Patient presents with  . Ear Fullness    Subjective: Patient is a 85 y.o. male here for ear fullness.  Recurrent issue. Hx of wax buildup. No pain, drainage, illness. +hearing loss. Looking into hearing aids.    ROS: HEENT: +hearing loss  Past Medical History:  Diagnosis Date  . Depression     Objective: BP 130/82 (BP Location: Left Arm, Patient Position: Sitting, Cuff Size: Normal)   Pulse 88   Temp (!) 97.1 F (36.2 C) (Temporal)   Ht 5\' 7"  (1.702 m)   Wt 246 lb (111.6 kg)   SpO2 95%   BMI 38.53 kg/m  General: Awake, appears stated age HEENT: canals 100% obstructed w cerumen; patent with nml TM's after removal Lungs: No accessory muscle use Psych: Age appropriate judgment and insight, normal affect and mood  Procedure note: Cerumen removal irrigation Verbal consent obtained. I personally used a lighted curette to remove cerumen b/l. Cerumen successfully removed. Pt reported immediate improvement. Pt tolerated procedure well. There were no immediate complications noted.   Assessment and Plan: Bilateral impacted cerumen  Successfully removed. Hearing improved to baseline. Aftercare instructions verbalized.  F/u as originally scheduled.  The patient voiced understanding and agreement to the plan.  [place], DO 07/01/19  4:28 PM

## 2019-08-05 ENCOUNTER — Telehealth: Payer: Self-pay | Admitting: Family Medicine

## 2019-08-05 NOTE — Telephone Encounter (Signed)
Advise on how often he gets them

## 2019-08-05 NOTE — Telephone Encounter (Signed)
Called left message to call back 

## 2019-08-05 NOTE — Telephone Encounter (Signed)
Copied from [place] [id number]. Topic: General - Other >> Aug 05, 2019  1:44 PM Keene Breath wrote: Reason for CRM: Patient called to speak with the nurse regarding his Testerone shots.  He would like to come to the office for these shots.  Please advise and call to discuss at [phone]

## 2019-08-05 NOTE — Telephone Encounter (Signed)
Usually every 2 weeks, but he was referred to the urology team for this. I will defer to their judgment. Ty.

## 2019-08-06 NOTE — Telephone Encounter (Signed)
Pt called back to speak with Robin.  Tried [facility] line, no answer.

## 2019-08-07 NOTE — Telephone Encounter (Signed)
They don't offer that service at their office? I don't mind if office policy allows, but I feel they would have him come in to at least teach him. Ty.

## 2019-08-07 NOTE — Telephone Encounter (Signed)
Spoke with pt. He states he has seen urology and they did recommend every 2 weeks but pt does not feel comfortable giving injections to himself. He is asking if he an come to our office and get injections here. I was not sure if we could do that since he was referred to a specialist and they are prescribing the medication. Please advise?

## 2019-08-07 NOTE — Telephone Encounter (Signed)
Patient will call and check with urologist and he has a nurse that comes out from [place].  Advised him to check with nurse to see if they can give it and they can get order from urologist and to also see if their office can give the injections for patients.

## 2019-09-11 ENCOUNTER — Telehealth: Payer: Self-pay

## 2019-09-11 NOTE — Telephone Encounter (Signed)
We did labs 4 mo ago. Ty.

## 2019-09-11 NOTE — Telephone Encounter (Signed)
Called informed of PCP response. The patient verbalized understanding/agreement to wait until next appointment.

## 2019-09-11 NOTE — Telephone Encounter (Signed)
Copied from CRM [id number]. Topic: Appointment Scheduling - Scheduling Inquiry for Clinic >> Sep 07, 2019 12:02 PM Mike Francis wrote: Reason for CRM:   Pt states that he hasn't had any lab work in over a year and needs to have that done.  Pt states that he can't wait until physical in September.

## 2019-10-27 ENCOUNTER — Telehealth: Payer: Self-pay | Admitting: Family Medicine

## 2019-10-27 NOTE — Telephone Encounter (Signed)
Called left message to call back 

## 2019-10-27 NOTE — Telephone Encounter (Signed)
Called informed of PCP instructions. He verbalized understanding. 

## 2019-10-27 NOTE — Telephone Encounter (Signed)
Unless you have a history of a severe allergic reaction to vaccines or see an allergist, there are no contraindications to taking the vaccine. If you do see an allergist, double check with them before getting it

## 2019-10-27 NOTE — Telephone Encounter (Signed)
Patient would like to advised on taking COVID 19 vaccine @ [facility] . Patient would like to know if he can get vaccinated and take Testone  Medications.

## 2019-10-30 DIAGNOSIS — R7989 Other specified abnormal findings of blood chemistry: Secondary | ICD-10-CM | POA: Diagnosis not present

## 2019-11-13 MED FILL — OMEPRAZOLE 20 MG CAP: 20 | 90 days supply | Qty: 90 | Fill #1

## 2019-11-13 MED FILL — SERTRALINE HCL 100 MG TABS: 100 | 90 days supply | Qty: 90 | Fill #1

## 2019-11-27 ENCOUNTER — Other Ambulatory Visit: Payer: Self-pay

## 2019-11-27 ENCOUNTER — Encounter: Payer: Self-pay | Admitting: Family Medicine

## 2019-11-27 ENCOUNTER — Ambulatory Visit (INDEPENDENT_AMBULATORY_CARE_PROVIDER_SITE_OTHER): Payer: Medicare Other | Admitting: Family Medicine

## 2019-11-27 VITALS — BP 120/82 | HR 89 | Temp 98.9°F | Ht 67.0 in | Wt 245.5 lb

## 2019-11-27 DIAGNOSIS — F325 Major depressive disorder, single episode, in full remission: Secondary | ICD-10-CM | POA: Diagnosis not present

## 2019-11-27 DIAGNOSIS — Z9989 Dependence on other enabling machines and devices: Secondary | ICD-10-CM | POA: Diagnosis not present

## 2019-11-27 DIAGNOSIS — K219 Gastro-esophageal reflux disease without esophagitis: Secondary | ICD-10-CM

## 2019-11-27 DIAGNOSIS — G4733 Obstructive sleep apnea (adult) (pediatric): Secondary | ICD-10-CM | POA: Diagnosis not present

## 2019-11-27 NOTE — Patient Instructions (Addendum)
Keep the diet clean and stay active.  We are sending an order in for your CPAP supplies. If the CPAP doesn't help, see your dentist.   Let us know if you need anything.

## 2019-11-27 NOTE — Progress Notes (Signed)
Chief Complaint  Patient presents with  . Follow-up    Subjective Mike Francis presents for f/u anxiety/depression.  He is currently being treated with Zoloft 100 mg/d.  Reports doing well since treatment. No thoughts of harming self or others. No self-medication with alcohol, prescription drugs or illicit drugs. Pt is not following with a counselor/psychologist.  GERD Hx of GERD taking omeprazole 40 mg/d. Doing well, no AE's. No wt loss.  When he stopped his omeprazole in the past, his reflux symptoms returned quickly.  Patient has a history of obstructive sleep apnea on CPAP.  He needs new tubing and supplies.  Has been having dry mouth and some shortness of breath at night.  He is following with a dentist as well and wonders if he needs to have a procedure done.  He has been using Biotene mouthwash.  He does not have a dry mouth during the day.  Past Medical History:  Diagnosis Date  . Depression    Allergies as of 11/27/2019   No Known Allergies     Medication List       Accurate as of November 27, 2019 12:30 PM. If you have any questions, ask your nurse or doctor.        STOP taking these medications   diclofenac sodium 1 % Gel Commonly known as: Voltaren Stopped by: Mike Pal, DO     TAKE these medications   acetaminophen 650 MG CR tablet Commonly known as: TYLENOL Take 650 mg by mouth every 8 (eight) hours as needed for pain.   methocarbamol 500 MG tablet Commonly known as: Robaxin Take 1 tablet (500 mg total) by mouth 2 (two) times daily as needed for muscle spasms.   omeprazole 20 MG capsule Commonly known as: PRILOSEC Take 1 capsule (20 mg total) by mouth daily.   sertraline 100 MG tablet Commonly known as: ZOLOFT Take 1 tablet (100 mg total) by mouth daily.            Durable Medical Equipment  (From admission, onward)         Start     Ordered   11/27/19 0000  For home use only DME continuous positive airway pressure (CPAP)     Question Answer Comment  Length of Need Lifetime   Patient has OSA or probable OSA Yes   Settings 11-15   CPAP supplies needed Mask, headgear, cushions, filters, heated tubing and water chamber      11/27/19 1049          Exam BP 120/82 (BP Location: Left Arm, Patient Position: Sitting, Cuff Size: Large)   Pulse 89   Temp 98.9 F (37.2 C) (Temporal)   Ht 5\' 7"  (1.702 m)   Wt 245 lb 8 oz (111.4 kg)   SpO2 95%   BMI 38.45 kg/m  General:  well developed, well nourished, in no apparent distress Neck: neck supple without adenopathy, thyromegaly, or masses Lungs:  clear to auscultation, breath sounds equal bilaterally, no respiratory distress Cardio:  regular rate and rhythm, no LE edema Psych: well oriented with normal range of affect and age-appropriate judgement/insight, alert and oriented x4.  Assessment and Plan  Depression, major, single episode, complete remission (HCC)  Gastroesophageal reflux disease, unspecified whether esophagitis present  OSA on CPAP - Plan: For home use only DME continuous positive airway pressure (CPAP)  1- Cont Zoloft. 2- Cont Omeprazole 3- Order for CPAP supplies rx'd. If that does not help dry mouth, f/u with dentist. Needs  to make sure humidity settings on machine ar sound and use an air humidifier in bedroom otherwise.  F/u in 6 mo or prn. The patient voiced understanding and agreement to the plan.  Mike Roche [place], DO 11/27/19 12:30 PM

## 2020-01-03 IMAGING — DX DG HIP (WITH OR WITHOUT PELVIS) 2-3V*R*
3 series · 3 of 3 positions shown · non-contrast
Comparison: None.

CLINICAL DATA: Chronic right lateral hip and knee pain. No known
injury.

EXAM:
DG HIP (WITH OR WITHOUT PELVIS) 2-3V RIGHT

[pelvis ap]
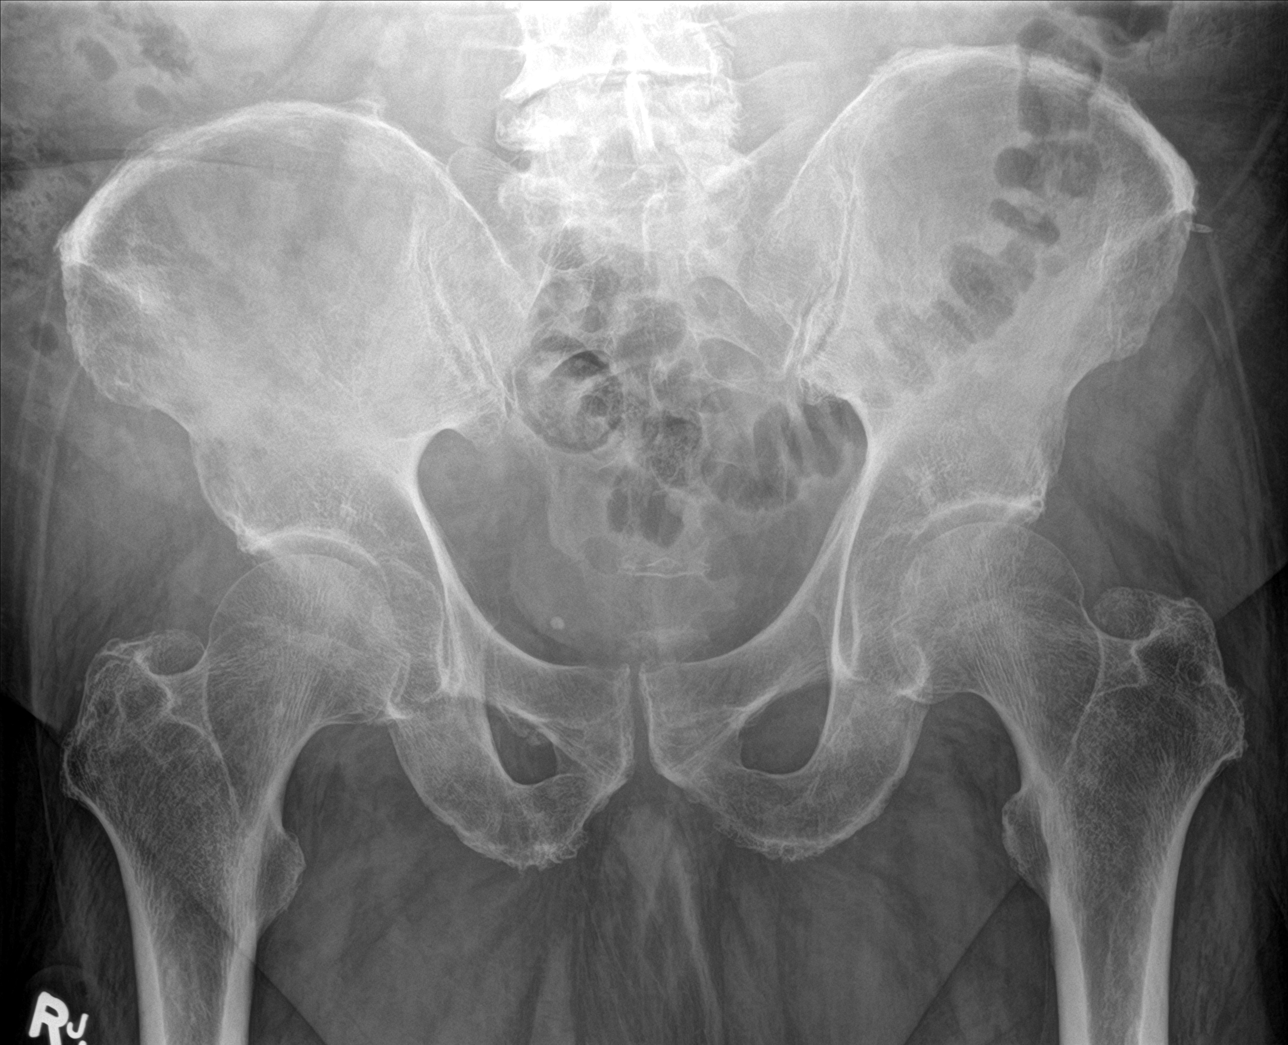

[hip ap]
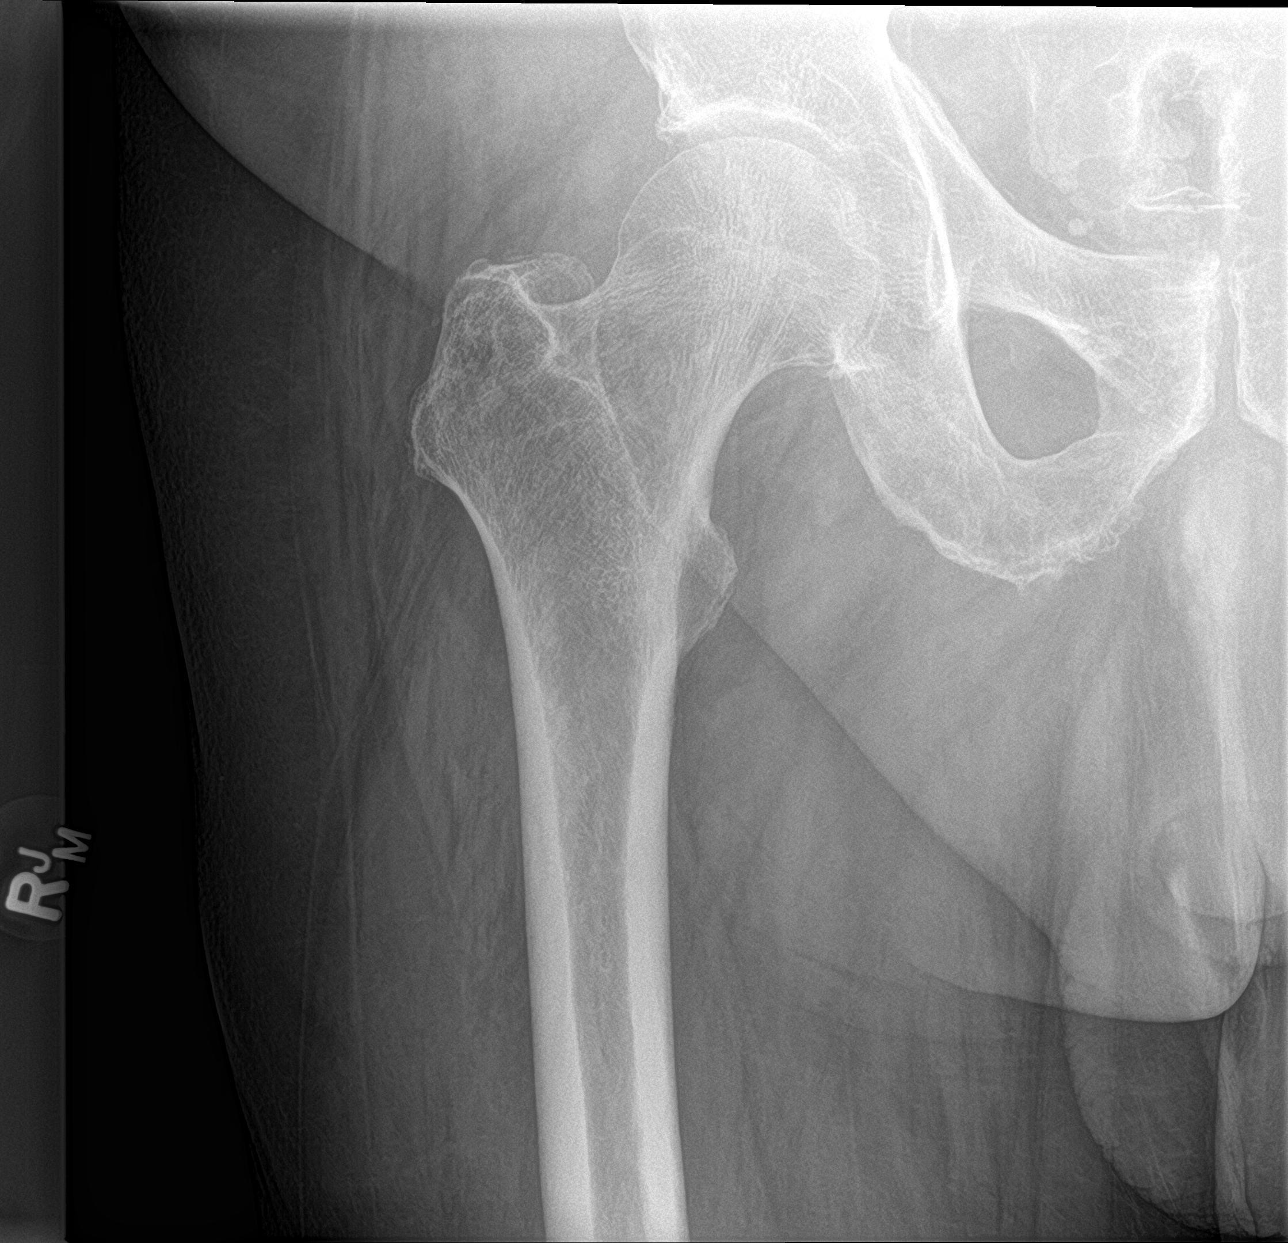

[hip frog leg]
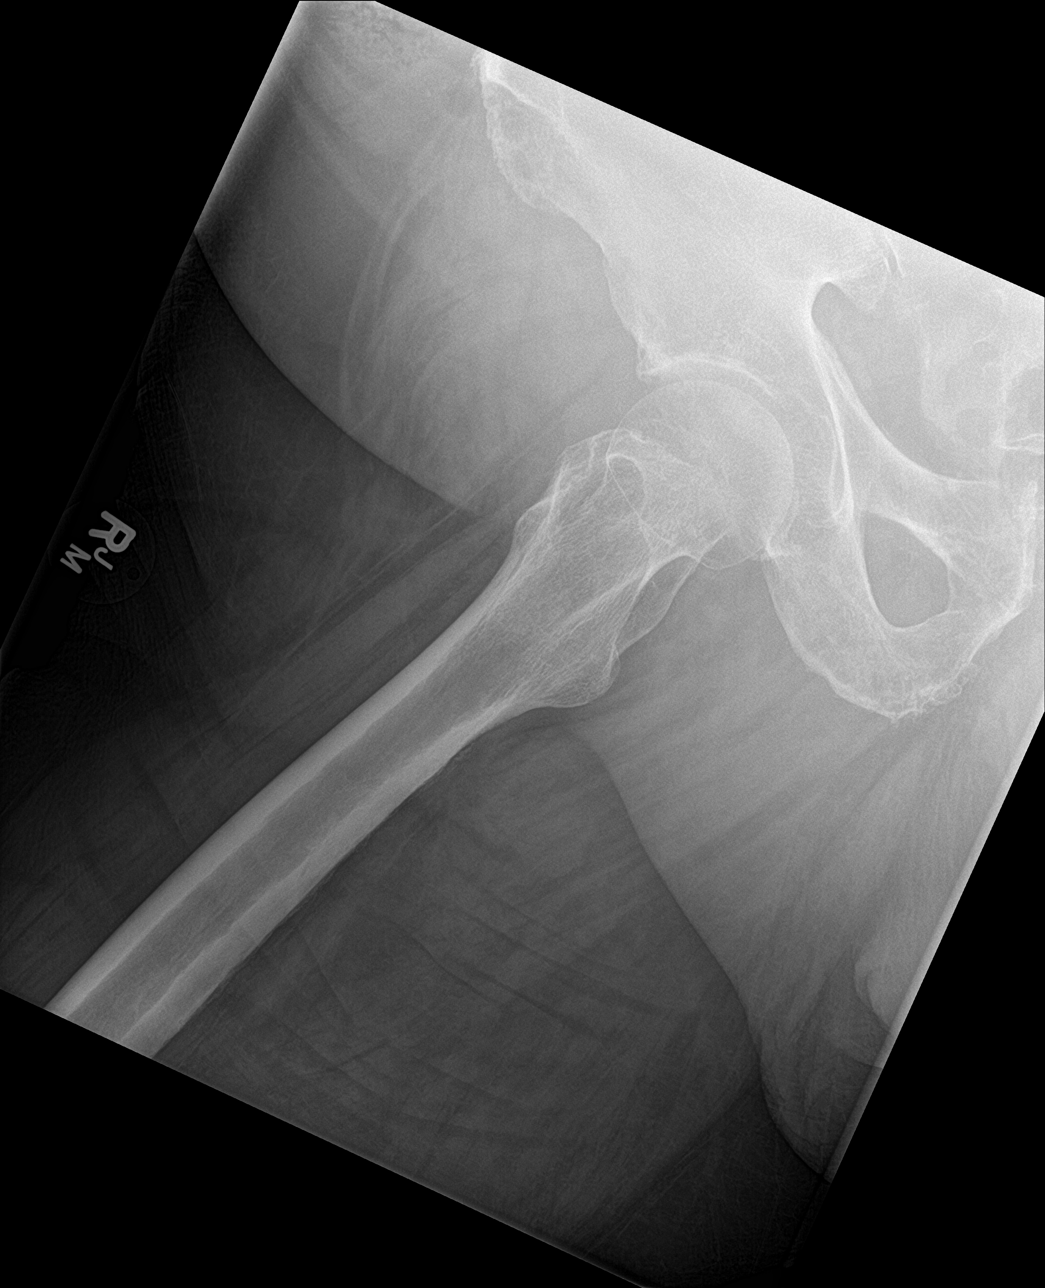

[3 of 3 positions shown; findings below may reference images not displayed]

FINDINGS: The mineralization and alignment are normal. There is no evidence of
acute fracture or dislocation. There is no evidence of femoral head
avascular necrosis. The hip joint spaces are preserved. There are
mild sacroiliac degenerative changes bilaterally. Lower lumbar
spondylosis also noted.
IMPRESSION: No acute osseous findings or significant hip arthropathy. Lower
lumbar spondylosis could contribute to hip pain.

## 2020-03-21 ENCOUNTER — Telehealth: Payer: Self-pay | Admitting: Family Medicine

## 2020-03-21 NOTE — Telephone Encounter (Signed)
Patient states nausea when taking medication :sertraline (ZOLOFT) 100 MG tablet.  Please advise, when patient should take medication.

## 2020-03-22 NOTE — Telephone Encounter (Signed)
Called left message to call back 

## 2020-03-22 NOTE — Telephone Encounter (Signed)
Called informed of PCP instructions. He took it last night and not in the am and feels much better today. Scheduled appointment in 2 weeks to follow-up on medication.

## 2020-03-22 NOTE — Telephone Encounter (Signed)
Take 1/2 tab and follow up in 2 weeks to see how things are going. Ty.

## 2020-04-04 ENCOUNTER — Ambulatory Visit: Payer: Medicare Other | Admitting: Family Medicine

## 2020-04-27 ENCOUNTER — Ambulatory Visit (INDEPENDENT_AMBULATORY_CARE_PROVIDER_SITE_OTHER): Payer: Medicare Other | Admitting: Family Medicine

## 2020-04-27 ENCOUNTER — Other Ambulatory Visit: Payer: Self-pay

## 2020-04-27 ENCOUNTER — Encounter: Payer: Self-pay | Admitting: Family Medicine

## 2020-04-27 VITALS — BP 120/80 | HR 126 | Temp 99.1°F | Ht 67.0 in | Wt 256.0 lb

## 2020-04-27 DIAGNOSIS — H6123 Impacted cerumen, bilateral: Secondary | ICD-10-CM | POA: Diagnosis not present

## 2020-04-27 MED FILL — OMEPRAZOLE 20 MG CAP: 20 | 90 days supply | Qty: 90 | Fill #2

## 2020-04-27 NOTE — Patient Instructions (Signed)
OK to use Debrox (peroxide) in the ear to loosen up wax. Also recommend using a bulb syringe (for removing boogers from baby's noses) to flush through warm water. Do not use Q-tips as this can impact wax further.  Good luck in the future, Gene!

## 2020-04-27 NOTE — Progress Notes (Signed)
Chief Complaint  Patient presents with  . Ear Fullness    Subjective: Patient is a 86 y.o. male here for ear wax removal.  Patient is here for an earwax removal.  Both sides have gradually been getting more wax over the past few months.  No pain, drainage, upper respiratory symptoms.  He does not use Q-tips.  Past Medical History:  Diagnosis Date  . Depression     Objective: BP 120/80 (BP Location: Left Arm, Patient Position: Sitting, Cuff Size: Normal)   Pulse (!) 126   Temp 99.1 F (37.3 C) (Oral)   Ht 5\' 7"  (1.702 m)   Wt 256 lb (116.1 kg)   SpO2 96%   BMI 40.10 kg/m  General: Awake, appears stated age HEENT: Bilateral ear canals 100% obstructed with cerumen; no external tenderness or drainage in the canal Lungs: No accessory muscle use Psych: Normal affect and mood  Assessment and Plan: Bilateral impacted cerumen  Successful lavage.  Home management options discussed and written down.  He is establishing with [facility] due to location.  He will let [facility] know if he needs anything. The patient voiced understanding and agreement to the plan.  [place], DO 04/27/20  10:13 AM

## 2020-05-10 ENCOUNTER — Encounter: Payer: Medicare Other | Admitting: Family Medicine

## 2020-05-10 DIAGNOSIS — M542 Cervicalgia: Secondary | ICD-10-CM | POA: Diagnosis not present

## 2020-05-10 DIAGNOSIS — Z7689 Persons encountering health services in other specified circumstances: Secondary | ICD-10-CM | POA: Diagnosis not present

## 2020-05-10 DIAGNOSIS — G8929 Other chronic pain: Secondary | ICD-10-CM | POA: Diagnosis not present

## 2020-05-10 DIAGNOSIS — M549 Dorsalgia, unspecified: Secondary | ICD-10-CM | POA: Diagnosis not present

## 2020-05-10 DIAGNOSIS — K219 Gastro-esophageal reflux disease without esophagitis: Secondary | ICD-10-CM | POA: Diagnosis not present

## 2020-05-10 DIAGNOSIS — Z23 Encounter for immunization: Secondary | ICD-10-CM | POA: Diagnosis not present

## 2020-05-10 DIAGNOSIS — G4733 Obstructive sleep apnea (adult) (pediatric): Secondary | ICD-10-CM | POA: Diagnosis not present

## 2020-05-10 DIAGNOSIS — E782 Mixed hyperlipidemia: Secondary | ICD-10-CM | POA: Diagnosis not present

## 2020-05-10 DIAGNOSIS — Z9989 Dependence on other enabling machines and devices: Secondary | ICD-10-CM | POA: Diagnosis not present

## 2020-05-20 MED FILL — SERTRALINE HCL 100 MG TABS: 100 | 90 days supply | Qty: 90 | Fill #2

## 2020-05-26 DIAGNOSIS — M549 Dorsalgia, unspecified: Secondary | ICD-10-CM | POA: Diagnosis not present

## 2020-05-26 DIAGNOSIS — G8929 Other chronic pain: Secondary | ICD-10-CM | POA: Diagnosis not present

## 2020-05-26 DIAGNOSIS — M545 Low back pain: Secondary | ICD-10-CM | POA: Diagnosis not present

## 2020-05-26 DIAGNOSIS — M542 Cervicalgia: Secondary | ICD-10-CM | POA: Diagnosis not present

## 2020-06-27 DIAGNOSIS — L910 Hypertrophic scar: Secondary | ICD-10-CM | POA: Diagnosis not present

## 2020-07-25 DIAGNOSIS — L910 Hypertrophic scar: Secondary | ICD-10-CM | POA: Diagnosis not present

## 2021-01-01 DEATH — deceased
